# Patient Record
Sex: Female | Born: 1970 | Race: White | Hispanic: No | Marital: Single | Smoking: Current every day smoker
Health system: Southern US, Community
[De-identification: ages and names within clinical notes are randomized; demographics above are authoritative.]

## PROBLEM LIST (undated history)

## (undated) DIAGNOSIS — L0291 Cutaneous abscess, unspecified: Secondary | ICD-10-CM

## (undated) DIAGNOSIS — F17200 Nicotine dependence, unspecified, uncomplicated: Secondary | ICD-10-CM

## (undated) DIAGNOSIS — N920 Excessive and frequent menstruation with regular cycle: Secondary | ICD-10-CM

## (undated) DIAGNOSIS — N943 Premenstrual tension syndrome: Secondary | ICD-10-CM

## (undated) HISTORY — DX: Cutaneous abscess, unspecified: L02.91

## (undated) HISTORY — PX: OTHER SURGICAL HISTORY: SHX169

## (undated) HISTORY — DX: Premenstrual tension syndrome: N94.3

## (undated) HISTORY — DX: Excessive and frequent menstruation with regular cycle: N92.0

## (undated) HISTORY — DX: Nicotine dependence, unspecified, uncomplicated: F17.200

---

## 2000-07-25 ENCOUNTER — Emergency Department (HOSPITAL_COMMUNITY): Admission: EM | Admit: 2000-07-25 | Discharge: 2000-07-26 | Payer: Self-pay | Admitting: Emergency Medicine

## 2000-08-16 ENCOUNTER — Ambulatory Visit (HOSPITAL_COMMUNITY): Admission: RE | Admit: 2000-08-16 | Discharge: 2000-08-16 | Payer: Self-pay | Admitting: Obstetrics and Gynecology

## 2000-08-16 ENCOUNTER — Encounter: Payer: Self-pay | Admitting: Obstetrics and Gynecology

## 2002-06-22 ENCOUNTER — Emergency Department (HOSPITAL_COMMUNITY): Admission: EM | Admit: 2002-06-22 | Discharge: 2002-06-22 | Payer: Self-pay | Admitting: *Deleted

## 2002-06-23 ENCOUNTER — Encounter: Payer: Self-pay | Admitting: *Deleted

## 2007-05-11 ENCOUNTER — Other Ambulatory Visit: Admission: RE | Admit: 2007-05-11 | Discharge: 2007-05-11 | Payer: Self-pay | Admitting: Obstetrics and Gynecology

## 2013-11-20 ENCOUNTER — Ambulatory Visit (INDEPENDENT_AMBULATORY_CARE_PROVIDER_SITE_OTHER): Payer: 59 | Admitting: Women's Health

## 2013-11-20 ENCOUNTER — Encounter: Payer: Self-pay | Admitting: Women's Health

## 2013-11-20 VITALS — BP 130/68 | Ht 64.0 in | Wt 184.0 lb

## 2013-11-20 DIAGNOSIS — L02239 Carbuncle of trunk, unspecified: Secondary | ICD-10-CM

## 2013-11-20 DIAGNOSIS — N611 Abscess of the breast and nipple: Secondary | ICD-10-CM

## 2013-11-20 DIAGNOSIS — L02229 Furuncle of trunk, unspecified: Secondary | ICD-10-CM

## 2013-11-20 MED ORDER — SULFAMETHOXAZOLE-TMP DS 800-160 MG PO TABS: 1.0000 | ORAL_TABLET | Freq: Two times a day (BID) | ORAL | Status: DC

## 2013-11-20 NOTE — Progress Notes (Signed)
Patient ID: Debbie Russell, female   DOB: [birth year], 43 y.o.   MRN: [id number]   [facility] Visit  Patient name: Debbie Russell MRN [id number]  Date of birth: [birth year]  CC & HPI:  Debbie Russell is a 43 y.o. Caucasian female presenting today for report of bump under Rt breast x 6 weeks, started small like a pimple, has gotten much bigger w/in last week. No drainage, heat, fever/chills. Never had bump like this before. Never had mammogram, last pap ~42yrs ago.   Pertinent History Reviewed:  Medical & Surgical Hx:   History reviewed. No pertinent past medical history. Past Surgical History  Procedure Laterality Date  . Tummy tuck     Medications: Reviewed & Updated - see associated section Social History: Reviewed -  reports that she has been smoking Cigarettes.  She has a 14.25 pack-year smoking history. She does not have any smokeless tobacco history on file.  Objective Findings:  Vitals: BP 130/68  Ht  (1.626 m)  Wt 184 lb (83.462 kg)  BMI 31.57 kg/m2  LMP 10/31/2013  Physical Examination: General appearance - alert, well appearing, and in no distress Skin - ~2cm boil under Rt breast towards sternum- where bra underwire lays, erythematous, small head, some induration, no drainage w/ compression, slightly tender, not bad  No results found for this or any previous visit (from the past 24 hour(s)).   Assessment & Plan:  A:   Boil under Rt breast  Needs screening mammogram  Needs pap & physical P:  Rx septra ds bid x 7d, wear bras w/o underwire if possible until heals  F/U 1wk for f/u and pap & physical  Call to schedule screening mammogram  Marge Duncans CNM, [facility] 11/20/2013 2:17 PM

## 2013-11-20 NOTE — Patient Instructions (Addendum)
Schedule a mammogram  Abscess An abscess is an infected area that contains a collection of pus and debris.It can occur in almost any part of the body. An abscess is also known as a furuncle or boil. CAUSES  An abscess occurs when tissue gets infected. This can occur from blockage of oil or sweat glands, infection of hair follicles, or a minor injury to the skin. As the body tries to fight the infection, pus collects in the area and creates pressure under the skin. This pressure causes pain. People with weakened immune systems have difficulty fighting infections and get certain abscesses more often.  SYMPTOMS Usually an abscess develops on the skin and becomes a painful mass that is red, warm, and tender. If the abscess forms under the skin, you may feel a moveable soft area under the skin. Some abscesses break open (rupture) on their own, but most will continue to get worse without care. The infection can spread deeper into the body and eventually into the bloodstream, causing you to feel ill.  DIAGNOSIS  Your caregiver will take your medical history and perform a physical exam. A sample of fluid may also be taken from the abscess to determine what is causing your infection. TREATMENT  Your caregiver may prescribe antibiotic medicines to fight the infection. However, taking antibiotics alone usually does not cure an abscess. Your caregiver may need to make a small cut (incision) in the abscess to drain the pus. In some cases, gauze is packed into the abscess to reduce pain and to continue draining the area. HOME CARE INSTRUCTIONS   Only take over-the-counter or prescription medicines for pain, discomfort, or fever as directed by your caregiver.  If you were prescribed antibiotics, take them as directed. Finish them even if you start to feel better.  If gauze is used, follow your caregiver's directions for changing the gauze.  To avoid spreading the infection:  Keep your draining abscess  covered with a bandage.  Wash your hands well.  Do not share personal care items, towels, or whirlpools with others.  Avoid skin contact with others.  Keep your skin and clothes clean around the abscess.  Keep all follow-up appointments as directed by your caregiver. SEEK MEDICAL CARE IF:   You have increased pain, swelling, redness, fluid drainage, or bleeding.  You have muscle aches, chills, or a general ill feeling.  You have a fever. MAKE SURE YOU:   Understand these instructions.  Will watch your condition.  Will get help right away if you are not doing well or get worse. Document Released: 11/26/2004 Document Revised: 08/18/2011 Document Reviewed: 05/01/2011 [facility] Patient Information 2015 [place], [place]. This information is not intended to replace advice given to you by your health care provider. Make sure you discuss any questions you have with your health care provider.

## 2013-11-23 ENCOUNTER — Other Ambulatory Visit: Payer: 59 | Admitting: Adult Health

## 2013-11-30 ENCOUNTER — Ambulatory Visit (INDEPENDENT_AMBULATORY_CARE_PROVIDER_SITE_OTHER): Payer: 59 | Admitting: Adult Health

## 2013-11-30 ENCOUNTER — Other Ambulatory Visit (HOSPITAL_COMMUNITY)
Admission: RE | Admit: 2013-11-30 | Discharge: 2013-11-30 | Disposition: A | Discharge status: Home / Self Care | Payer: 59 | Source: Ambulatory Visit | Attending: Adult Health | Admitting: Adult Health

## 2013-11-30 ENCOUNTER — Encounter: Payer: Self-pay | Admitting: Adult Health

## 2013-11-30 VITALS — BP 138/60 | HR 78 | Ht 64.25 in | Wt 184.0 lb

## 2013-11-30 DIAGNOSIS — F17210 Nicotine dependence, cigarettes, uncomplicated: Secondary | ICD-10-CM

## 2013-11-30 DIAGNOSIS — Z1151 Encounter for screening for human papillomavirus (HPV): Secondary | ICD-10-CM | POA: Diagnosis present

## 2013-11-30 DIAGNOSIS — Z113 Encounter for screening for infections with a predominantly sexual mode of transmission: Secondary | ICD-10-CM | POA: Diagnosis present

## 2013-11-30 DIAGNOSIS — N920 Excessive and frequent menstruation with regular cycle: Secondary | ICD-10-CM

## 2013-11-30 DIAGNOSIS — F17200 Nicotine dependence, unspecified, uncomplicated: Secondary | ICD-10-CM

## 2013-11-30 DIAGNOSIS — Z1212 Encounter for screening for malignant neoplasm of rectum: Secondary | ICD-10-CM

## 2013-11-30 DIAGNOSIS — N943 Premenstrual tension syndrome: Secondary | ICD-10-CM

## 2013-11-30 DIAGNOSIS — L0291 Cutaneous abscess, unspecified: Secondary | ICD-10-CM

## 2013-11-30 DIAGNOSIS — Z01419 Encounter for gynecological examination (general) (routine) without abnormal findings: Secondary | ICD-10-CM

## 2013-11-30 HISTORY — DX: Nicotine dependence, unspecified, uncomplicated: F17.200

## 2013-11-30 HISTORY — DX: Cutaneous abscess, unspecified: L02.91

## 2013-11-30 HISTORY — DX: Excessive and frequent menstruation with regular cycle: N92.0

## 2013-11-30 HISTORY — DX: Premenstrual tension syndrome: N94.3

## 2013-11-30 LAB — HEMOCCULT GUIAC POC 1CARD (OFFICE): Fecal Occult Blood, POC: NEGATIVE

## 2013-11-30 MED ORDER — BUPROPION HCL ER (XL) 150 MG PO TB24
150.0000 mg | ORAL_TABLET | Freq: Every day | ORAL | Status: AC
Start: 2013-11-30 — End: ?

## 2013-11-30 MED ORDER — SULFAMETHOXAZOLE-TMP DS 800-160 MG PO TABS: 1.0000 | ORAL_TABLET | Freq: Two times a day (BID) | ORAL | Status: AC

## 2013-11-30 NOTE — Patient Instructions (Addendum)
Smoking Cessation Quitting smoking is important to your health and has many advantages. However, it is not always easy to quit since nicotine is a very addictive drug. Oftentimes, people try 3 times or more before being able to quit. This document explains the best ways for you to prepare to quit smoking. Quitting takes hard work and a lot of effort, but you can do it. ADVANTAGES OF QUITTING SMOKING  You will live longer, feel better, and live better.  Your body will feel the impact of quitting smoking almost immediately.  Within 20 minutes, blood pressure decreases. Your pulse returns to its normal level.  After 8 hours, carbon monoxide levels in the blood return to normal. Your oxygen level increases.  After 24 hours, the chance of having a heart attack starts to decrease. Your breath, hair, and body stop smelling like smoke.  After 48 hours, damaged nerve endings begin to recover. Your sense of taste and smell improve.  After 72 hours, the body is virtually free of nicotine. Your bronchial tubes relax and breathing becomes easier.  After 2 to 12 weeks, lungs can hold more air. Exercise becomes easier and circulation improves.  The risk of having a heart attack, stroke, cancer, or lung disease is greatly reduced.  After 1 year, the risk of coronary heart disease is cut in half.  After 5 years, the risk of stroke falls to the same as a nonsmoker.  After 10 years, the risk of lung cancer is cut in half and the risk of other cancers decreases significantly.  After 15 years, the risk of coronary heart disease drops, usually to the level of a nonsmoker.  If you are pregnant, quitting smoking will improve your chances of having a healthy baby.  The people you live with, especially any children, will be healthier.  You will have extra money to spend on things other than cigarettes. QUESTIONS TO THINK ABOUT BEFORE ATTEMPTING TO QUIT You may want to talk about your answers with your  health care provider.  Why do you want to quit?  If you tried to quit in the past, what helped and what did not?  What will be the most difficult situations for you after you quit? How will you plan to handle them?  Who can help you through the tough times? Your family? Friends? A health care provider?  What pleasures do you get from smoking? What ways can you still get pleasure if you quit? Here are some questions to ask your health care provider:  How can you help me to be successful at quitting?  What medicine do you think would be best for me and how should I take it?  What should I do if I need more help?  What is smoking withdrawal like? How can I get information on withdrawal? GET READY  Set a quit date.  Change your environment by getting rid of all cigarettes, ashtrays, matches, and lighters in your home, car, or work. Do not let people smoke in your home.  Review your past attempts to quit. Think about what worked and what did not. GET SUPPORT AND ENCOURAGEMENT You have a better chance of being successful if you have help. You can get support in many ways.  Tell your family, friends, and coworkers that you are going to quit and need their support. Ask them not to smoke around you.  Get individual, group, or telephone counseling and support. Programs are available at local hospitals and health centers. Call   your local health department for information about programs in your area.  Spiritual beliefs and practices may help some smokers quit.  Download a "quit meter" on your computer to keep track of quit statistics, such as how long you have gone without smoking, cigarettes not smoked, and money saved.  Get a self-help book about quitting smoking and staying off tobacco. LEARN NEW SKILLS AND BEHAVIORS  Distract yourself from urges to smoke. Talk to someone, go for a walk, or occupy your time with a task.  Change your normal routine. Take a different route to work.  Drink tea instead of coffee. Eat breakfast in a different place.  Reduce your stress. Take a hot bath, exercise, or read a book.  Plan something enjoyable to do every day. Reward yourself for not smoking.  Explore interactive web-based programs that specialize in helping you quit. GET MEDICINE AND USE IT CORRECTLY Medicines can help you stop smoking and decrease the urge to smoke. Combining medicine with the above behavioral methods and support can greatly increase your chances of successfully quitting smoking.  Nicotine replacement therapy helps deliver nicotine to your body without the negative effects and risks of smoking. Nicotine replacement therapy includes nicotine gum, lozenges, inhalers, nasal sprays, and skin patches. Some may be available over-the-counter and others require a prescription.  Antidepressant medicine helps people abstain from smoking, but how this works is unknown. This medicine is available by prescription.  Nicotinic receptor partial agonist medicine simulates the effect of nicotine in your brain. This medicine is available by prescription. Ask your health care provider for advice about which medicines to use and how to use them based on your health history. Your health care provider will tell you what side effects to look out for if you choose to be on a medicine or therapy. Carefully read the information on the package. Do not use any other product containing nicotine while using a nicotine replacement product.  RELAPSE OR DIFFICULT SITUATIONS Most relapses occur within the first 3 months after quitting. Do not be discouraged if you start smoking again. Remember, most people try several times before finally quitting. You may have symptoms of withdrawal because your body is used to nicotine. You may crave cigarettes, be irritable, feel very hungry, cough often, get headaches, or have difficulty concentrating. The withdrawal symptoms are only temporary. They are strongest  when you first quit, but they will go away within 10-14 days. To reduce the chances of relapse, try to:  Avoid drinking alcohol. Drinking lowers your chances of successfully quitting.  Reduce the amount of caffeine you consume. Once you quit smoking, the amount of caffeine in your body increases and can give you symptoms, such as a rapid heartbeat, sweating, and anxiety.  Avoid smokers because they can make you want to smoke.  Do not let weight gain distract you. Many smokers will gain weight when they quit, usually less than 10 pounds. Eat a healthy diet and stay active. You can always lose the weight gained after you quit.  Find ways to improve your mood other than smoking. FOR MORE INFORMATION  www.smokefree.gov  Document Released: 02/10/2001 Document Revised: 07/03/2013 Document Reviewed: 05/28/2011 [facility] Patient Information 2015 [place], [place]. This information is not intended to replace advice given to you by your health care provider. Make sure you discuss any questions you have with your health care provider. Premenstrual Syndrome Premenstrual syndrome (PMS) is a condition that consists of physical, emotional, and behavioral symptoms that affect women of childbearing age.  PMS occurs 5-14 days before the start of a menstrual period and often recurs in a predictable pattern. The symptoms go away a few days after the menstrual period starts. PMS can interfere in many ways with normal daily activities and can range from mild to severe. When PMS is considered severe, it may be diagnosed as premenstrual dysphoric disorder (PMDD). A small percentage of women are affected by PMS symptoms and an even smaller percentage of those women are affected by PMDD.  CAUSES  The exact cause of PMS is unknown, but it seems to be related to cyclic hormone changes that happen before menstruation. These hormones are thought to affect chemicals in the brain (serotonin) that can influence a person's mood.    SYMPTOMS  Symptoms of PMS recur consistently from month to month and go away completely after the menstrual period starts. The most common emotional or behavioral symptom is mood swings. These mood swings can be disabling and interfere with normal activities of daily living. Other common symptoms include depression and angry outbursts. Other symptoms may include:   Irritability.  Anxiety.  Crying spells.   Food cravings or appetite changes.   Changes in sexual desire.   Confusion.   Aggression.   Social withdrawal.   Poor concentration. The most common physical symptoms include a sense of bloating, breast pain, headaches, and extreme fatigue. Other physical symptoms include:   Backaches.   Swelling of the hands and feet.   Weight gain.   Hot flashes.  DIAGNOSIS  To make a diagnosis, your caregiver will ask questions to confirm that you are having a pattern of symptoms. Symptoms must:   Be present 5 days before the start of your period and be present at least 3 months in a row.   End within 4 days after your period starts.   Interfere with some of your normal activities.  Other conditions, such as thyroid disease, depression, and migraine headaches must be ruled out before a diagnosis of PMS is confirmed.  TREATMENT  Your caregiver may suggest ways to maintain a healthy lifestyle, such as exercise. Over-the-counter pain relievers may ease cramps, aches, pains, headaches, and breast tenderness. However, selective serotonin reuptake inhibitors (SSRIs) are medicines that are most beneficial in improving PMS if taken in the second half of the monthly cycle. They may be taken on a daily basis. The most effective oral contraceptive pill used for symptoms of PMS is one that contains the ingredient drospirenone. Taking 4 days off of the pill instead of the usual 7 days also has shown to increase effectiveness.  There are a number of drugs, dietary supplements, vitamins,  and water pills (diuretics) which have been suggested to be helpful but have not shown to be of any benefit to improving PMS symptoms.  HOME CARE INSTRUCTIONS   For 2-3 months, write down your symptoms, their severity, and how long they last. This may help your caregiver prescribe the best treatment for your symptoms.  Exercise regularly as suggested by your caregiver.  Eat a regular, well-balanced diet.  Avoid caffeine, alcohol, and tobacco consumption.  Limit salt and salty foods to lessen bloating and fluid retention.  Get enough sleep. Practice relaxation techniques.  Drink enough fluids to keep your urine clear or pale yellow.  Take medicines as directed by your caregiver.  Limit stress.  Take a multivitamin as directed by your caregiver. Document Released: 02/14/2000 Document Revised: 11/11/2011 Document Reviewed: 07/06/2011 [facility] Patient Information 2015 [place], [place]. This information is not  intended to replace advice given to you by your health care provider. Make sure you discuss any questions you have with your health care provider. Abscess An abscess is an infected area that contains a collection of pus and debris.It can occur in almost any part of the body. An abscess is also known as a furuncle or boil. CAUSES  An abscess occurs when tissue gets infected. This can occur from blockage of oil or sweat glands, infection of hair follicles, or a minor injury to the skin. As the body tries to fight the infection, pus collects in the area and creates pressure under the skin. This pressure causes pain. People with weakened immune systems have difficulty fighting infections and get certain abscesses more often.  SYMPTOMS Usually an abscess develops on the skin and becomes a painful mass that is red, warm, and tender. If the abscess forms under the skin, you may feel a moveable soft area under the skin. Some abscesses break open (rupture) on their own, but most will continue to  get worse without care. The infection can spread deeper into the body and eventually into the bloodstream, causing you to feel ill.  DIAGNOSIS  Your caregiver will take your medical history and perform a physical exam. A sample of fluid may also be taken from the abscess to determine what is causing your infection. TREATMENT  Your caregiver may prescribe antibiotic medicines to fight the infection. However, taking antibiotics alone usually does not cure an abscess. Your caregiver may need to make a small cut (incision) in the abscess to drain the pus. In some cases, gauze is packed into the abscess to reduce pain and to continue draining the area. HOME CARE INSTRUCTIONS   Only take over-the-counter or prescription medicines for pain, discomfort, or fever as directed by your caregiver.  If you were prescribed antibiotics, take them as directed. Finish them even if you start to feel better.  If gauze is used, follow your caregiver's directions for changing the gauze.  To avoid spreading the infection:  Keep your draining abscess covered with a bandage.  Wash your hands well.  Do not share personal care items, towels, or whirlpools with others.  Avoid skin contact with others.  Keep your skin and clothes clean around the abscess.  Keep all follow-up appointments as directed by your caregiver. SEEK MEDICAL CARE IF:   You have increased pain, swelling, redness, fluid drainage, or bleeding.  You have muscle aches, chills, or a general ill feeling.  You have a fever. MAKE SURE YOU:   Understand these instructions.  Will watch your condition.  Will get help right away if you are not doing well or get worse. Document Released: 11/26/2004 Document Revised: 08/18/2011 Document Reviewed: 05/01/2011 [facility] Patient Information 2015 [place]. This information is not intended to replace advice given to you by your health care provider. Make sure you discuss any questions you have  with your health care provider. Return in 2 weeks Levonorgestrel intrauterine device (IUD) What is this medicine? LEVONORGESTREL IUD (LEE voe nor jes trel) is a contraceptive (birth control) device. The device is placed inside the uterus by a healthcare professional. It is used to prevent pregnancy and can also be used to treat heavy bleeding that occurs during your period. Depending on the device, it can be used for 3 to 5 years. This medicine may be used for other purposes; ask your health care provider or pharmacist if you have questions. COMMON BRAND NAME(S): Theotis BurrowLILETTA, Mirena, Christean GriefSkyla  What should I tell my health care provider before I take this medicine? They need to know if you have any of these conditions: -abnormal Pap smear -cancer of the breast, uterus, or cervix -diabetes -endometritis -genital or pelvic infection now or in the past -have more than one sexual partner or your partner has more than one partner -heart disease -history of an ectopic or tubal pregnancy -immune system problems -IUD in place -liver disease or tumor -problems with blood clots or take blood-thinners -use intravenous drugs -uterus of unusual shape -vaginal bleeding that has not been explained -an unusual or allergic reaction to levonorgestrel, other hormones, silicone, or polyethylene, medicines, foods, dyes, or preservatives -pregnant or trying to get pregnant -breast-feeding How should I use this medicine? This device is placed inside the uterus by a health care professional. Talk to your pediatrician regarding the use of this medicine in children. Special care may be needed. Overdosage: If you think you have taken too much of this medicine contact a poison control center or emergency room at once. NOTE: This medicine is only for you. Do not share this medicine with others. What if I miss a dose? This does not apply. What may interact with this medicine? Do not take this medicine with any of the  following medications: -amprenavir -bosentan -fosamprenavir This medicine may also interact with the following medications: -aprepitant -barbiturate medicines for inducing sleep or treating seizures -bexarotene -griseofulvin -medicines to treat seizures like carbamazepine, ethotoin, felbamate, oxcarbazepine, phenytoin, topiramate -modafinil -pioglitazone -rifabutin -rifampin -rifapentine -some medicines to treat HIV infection like atazanavir, indinavir, lopinavir, nelfinavir, tipranavir, ritonavir -St. John's wort -warfarin This list may not describe all possible interactions. Give your health care provider a list of all the medicines, herbs, non-prescription drugs, or dietary supplements you use. Also tell them if you smoke, drink alcohol, or use illegal drugs. Some items may interact with your medicine. What should I watch for while using this medicine? Visit your doctor or health care professional for regular check ups. See your doctor if you or your partner has sexual contact with others, becomes HIV positive, or gets a sexual transmitted disease. This product does not protect you against HIV infection (AIDS) or other sexually transmitted diseases. You can check the placement of the IUD yourself by reaching up to the top of your vagina with clean fingers to feel the threads. Do not pull on the threads. It is a good habit to check placement after each menstrual period. Call your doctor right away if you feel more of the IUD than just the threads or if you cannot feel the threads at all. The IUD may come out by itself. You may become pregnant if the device comes out. If you notice that the IUD has come out use a backup birth control method like condoms and call your health care provider. Using tampons will not change the position of the IUD and are okay to use during your period. What side effects may I notice from receiving this medicine? Side effects that you should report to your  doctor or health care professional as soon as possible: -allergic reactions like skin rash, itching or hives, swelling of the face, lips, or tongue -fever, flu-like symptoms -genital sores -high blood pressure -no menstrual period for 6 weeks during use -pain, swelling, warmth in the leg -pelvic pain or tenderness -severe or sudden headache -signs of pregnancy -stomach cramping -sudden shortness of breath -trouble with balance, talking, or walking -unusual vaginal bleeding, discharge -yellowing of  the eyes or skin Side effects that usually do not require medical attention (report to your doctor or health care professional if they continue or are bothersome): -acne -breast pain -change in sex drive or performance -changes in weight -cramping, dizziness, or faintness while the device is being inserted -headache -irregular menstrual bleeding within first 3 to 6 months of use -nausea This list may not describe all possible side effects. Call your doctor for medical advice about side effects. You may report side effects to FDA at 1-800-FDA-1088. Where should I keep my medicine? This does not apply. NOTE: This sheet is a summary. It may not cover all possible information. If you have questions about this medicine, talk to your doctor, pharmacist, or health care provider.  2015, [employer]. (2011-03-19 13:54:04)

## 2013-11-30 NOTE — Progress Notes (Signed)
Patient ID: Debbie Russell, female   DOB: 12/26/1970, 43 y.o.   MRN: [id number] History of Present Illness: Debbie Russell is a 43 year old white female, in for a pap and physical.She is complaining of a boil under right breast and heavy periods with some cramps and PMS like symptoms.   Current Medications, Allergies, Past Medical History, Past Surgical History, Family History and Social History were reviewed in [facility] electronic medical record.     Review of Systems: Patient denies any headaches, blurred vision, shortness of breath, chest pain, abdominal pain, problems with bowel movements, urination, or intercourse. No joint pain, see HPI for positives.     Physical Exam:BP 138/60  Pulse 78  Ht 5' 4.25" (1.632 m)  Wt 184 lb (83.462 kg)  BMI 31.34 kg/m2  LMP 11/22/2013 General:  Well developed, well nourished, no acute distress Skin:  Warm and dry Neck:  Midline trachea, normal thyroid Lungs; Clear to auscultation bilaterally Breast:  No dominant palpable mass, retraction, or nipple discharge, there is a 3 x 1.5 cm red abscess under right breast,skin is intact Cardiovascular: Regular rate and rhythm Abdomen:  Soft, non tender, no hepatosplenomegaly, sp tummy tuck Pelvic:  External genitalia is normal in appearance.  The vagina is normal in appearance.   The cervix is bulbous.Pap with HPV and GC/CHL performed,by Verne SpurrM Flitchum, NP student.  Uterus is felt to be normal size, shape, and contour.  No   adnexal masses or tenderness noted. Rectal: Good sphincter tone, no polyps, or hemorrhoids felt.  Hemoccult negative. Extremities:  No swelling or varicosities noted Psych:  No mood changes, alert and cooperative,seems happy Discussed ablation vs IUD vs POP for periods.  Impression: Well woman gyn exam Abscess Heavy periods PMS Nicotine addiction    Plan: Rx septra ds 1 bid x 14 days #28 with refill Rx wellbutrin 150 mg XL #30 1 daily with 6 refills Return in 2 weeks for follow up for  abscess and discuss smoking cessation and period management Physical in 1 year Review handouts on ablation.IUD and smoking cessation

## 2013-12-06 LAB — CYTOLOGY - PAP

## 2013-12-13 ENCOUNTER — Ambulatory Visit: Payer: 59 | Admitting: Adult Health

## 2018-08-16 ENCOUNTER — Other Ambulatory Visit (HOSPITAL_COMMUNITY): Payer: Self-pay | Admitting: Nurse Practitioner

## 2018-08-16 ENCOUNTER — Other Ambulatory Visit: Payer: Self-pay | Admitting: Nurse Practitioner

## 2018-08-16 DIAGNOSIS — N921 Excessive and frequent menstruation with irregular cycle: Secondary | ICD-10-CM

## 2018-08-16 DIAGNOSIS — N852 Hypertrophy of uterus: Secondary | ICD-10-CM

## 2018-08-16 DIAGNOSIS — Z1231 Encounter for screening mammogram for malignant neoplasm of breast: Secondary | ICD-10-CM

## 2018-08-19 ENCOUNTER — Ambulatory Visit (HOSPITAL_COMMUNITY)
Admission: RE | Admit: 2018-08-19 | Discharge: 2018-08-19 | Disposition: A | Discharge status: Home / Self Care | Payer: Self-pay | Source: Ambulatory Visit | Attending: Nurse Practitioner | Admitting: Nurse Practitioner

## 2018-08-19 ENCOUNTER — Other Ambulatory Visit: Payer: Self-pay

## 2018-08-19 DIAGNOSIS — N852 Hypertrophy of uterus: Secondary | ICD-10-CM | POA: Insufficient documentation

## 2018-08-19 DIAGNOSIS — N921 Excessive and frequent menstruation with irregular cycle: Secondary | ICD-10-CM | POA: Insufficient documentation

## 2018-08-25 ENCOUNTER — Ambulatory Visit (HOSPITAL_COMMUNITY): Payer: Self-pay

## 2018-08-25 ENCOUNTER — Encounter (HOSPITAL_COMMUNITY): Payer: Self-pay

## 2019-09-30 ENCOUNTER — Encounter (HOSPITAL_COMMUNITY): Payer: Self-pay | Admitting: Emergency Medicine

## 2019-09-30 ENCOUNTER — Other Ambulatory Visit: Payer: Self-pay

## 2019-09-30 ENCOUNTER — Emergency Department (HOSPITAL_COMMUNITY)
Admission: EM | Admit: 2019-09-30 | Discharge: 2019-09-30 | Disposition: A | Discharge status: Home / Self Care | Payer: Medicaid Other | Attending: Emergency Medicine | Admitting: Emergency Medicine

## 2019-09-30 DIAGNOSIS — R109 Unspecified abdominal pain: Secondary | ICD-10-CM | POA: Insufficient documentation

## 2019-09-30 DIAGNOSIS — Z5321 Procedure and treatment not carried out due to patient leaving prior to being seen by health care provider: Secondary | ICD-10-CM | POA: Insufficient documentation

## 2019-09-30 LAB — URINALYSIS, ROUTINE W REFLEX MICROSCOPIC
Bilirubin Urine: NEGATIVE
Glucose, UA: NEGATIVE mg/dL
Ketones, ur: NEGATIVE mg/dL
Leukocytes,Ua: NEGATIVE
Nitrite: NEGATIVE
Protein, ur: 30 mg/dL — AB
Specific Gravity, Urine: 1.023 (ref 1.005–1.030)
pH: 6 (ref 5.0–8.0)

## 2019-09-30 LAB — CBC
HCT: 36.7 % (ref 36.0–46.0)
Hemoglobin: 11.8 g/dL — ABNORMAL LOW (ref 12.0–15.0)
MCH: 26.3 pg (ref 26.0–34.0)
MCHC: 32.2 g/dL (ref 30.0–36.0)
MCV: 81.9 fL (ref 80.0–100.0)
Platelets: 303 10*3/uL (ref 150–400)
RBC: 4.48 MIL/uL (ref 3.87–5.11)
RDW: 14.6 % (ref 11.5–15.5)
WBC: 17 10*3/uL — ABNORMAL HIGH (ref 4.0–10.5)
nRBC: 0 % (ref 0.0–0.2)

## 2019-09-30 LAB — BASIC METABOLIC PANEL
Anion gap: 10 (ref 5–15)
BUN: 12 mg/dL (ref 6–20)
CO2: 18 mmol/L — ABNORMAL LOW (ref 22–32)
Calcium: 8.2 mg/dL — ABNORMAL LOW (ref 8.9–10.3)
Chloride: 103 mmol/L (ref 98–111)
Creatinine, Ser: 0.83 mg/dL (ref 0.44–1.00)
GFR calc Af Amer: >60 mL/min (ref >60)
GFR calc non Af Amer: >60 mL/min (ref >60)
Glucose, Bld: 123 mg/dL — ABNORMAL HIGH (ref 70–99)
Potassium: 2.8 mmol/L — ABNORMAL LOW (ref 3.5–5.1)
Sodium: 131 mmol/L — ABNORMAL LOW (ref 135–145)

## 2019-09-30 NOTE — ED Triage Notes (Signed)
Pt c/o of abdominal pain with n/d since last night

## 2019-09-30 NOTE — ED Notes (Signed)
Pt called for room. No answer. Pt not in WR

## 2020-03-16 IMAGING — US US PELVIS COMPLETE WITH TRANSVAGINAL
1 series · 13 of 25 positions shown · non-contrast
Comparison: None

CLINICAL DATA: Enlarged uterus, menorrhagia with irregular cycle,
multiple menses per month for 6 months with pain

EXAM:
TRANSABDOMINAL AND TRANSVAGINAL ULTRASOUND OF PELVIS
TECHNIQUE: Both transabdominal and transvaginal ultrasound examinations of the
pelvis were performed. Transabdominal technique was performed for
global imaging of the pelvis including uterus, ovaries, adnexal
regions, and pelvic cul-de-sac. It was necessary to proceed with
endovaginal exam following the transabdominal exam to visualize the
endometrium and ovaries.

[Series 1: us pelvis complete with transvaginal · 0.25mm/px · 13 of 215 slices shown]
[im 1/215]
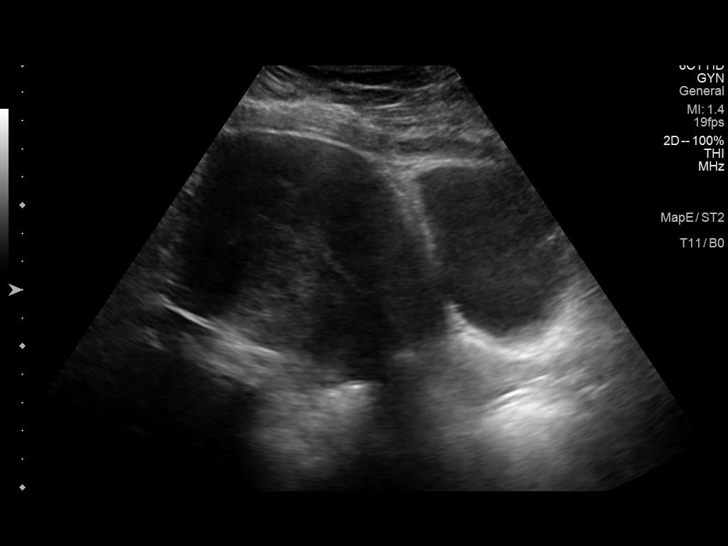
[im 18/215]
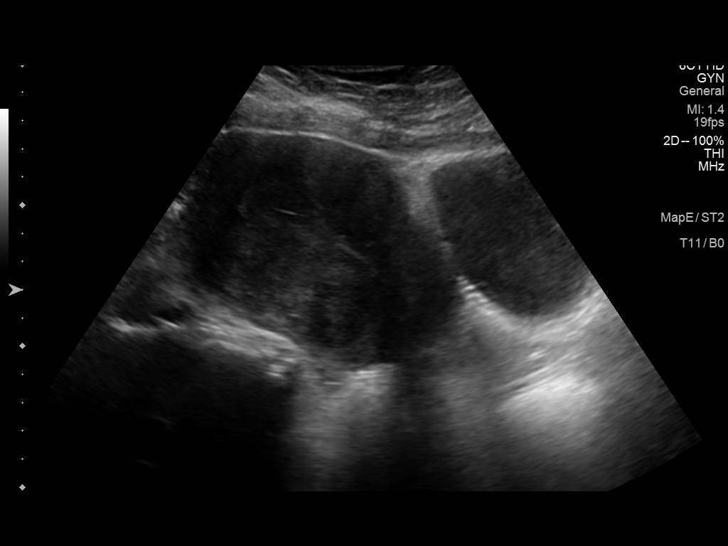
[im 36/215]
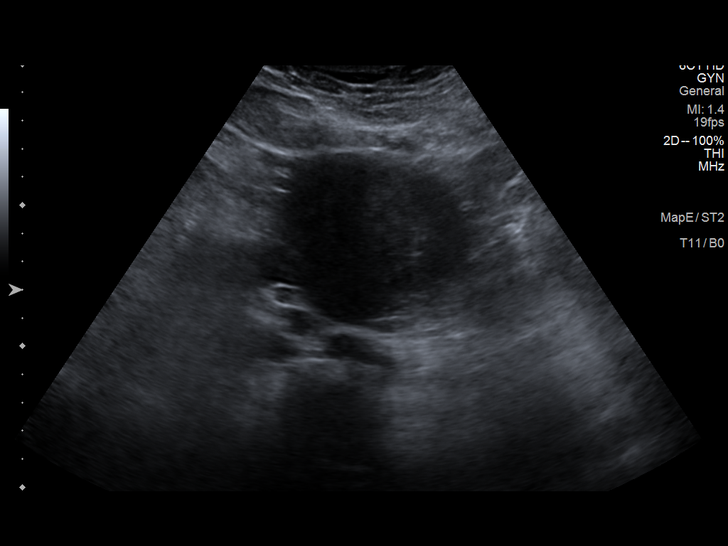
[im 54/215]
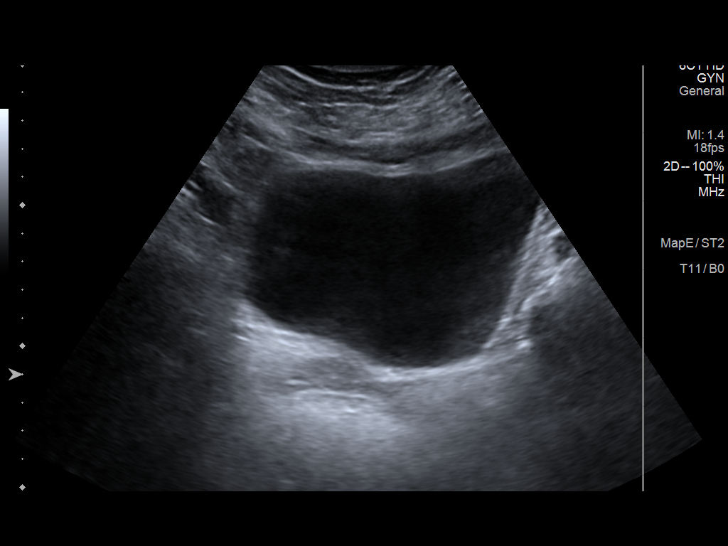
[im 72/215]
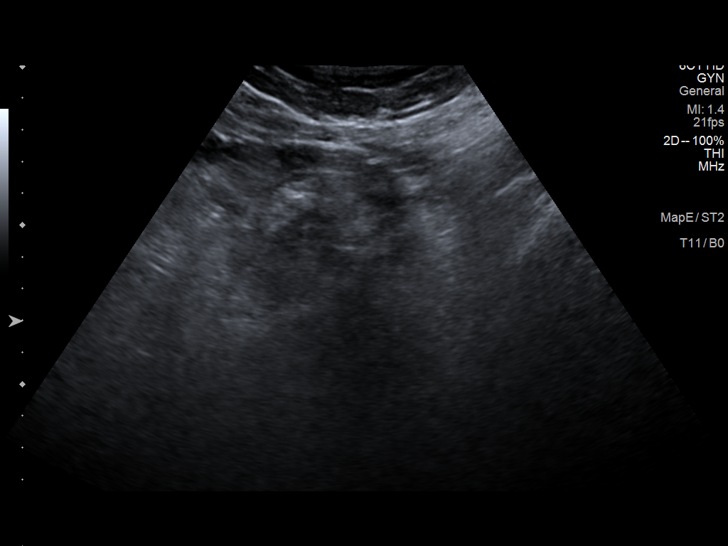
[im 90/215]
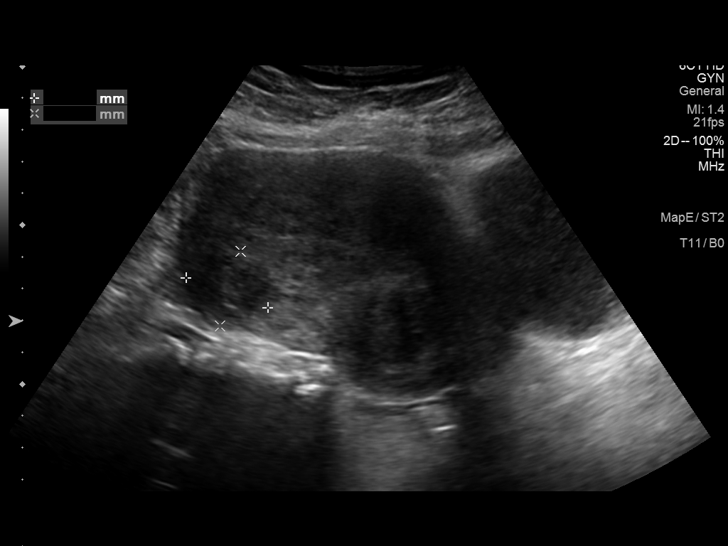
[im 108/215]
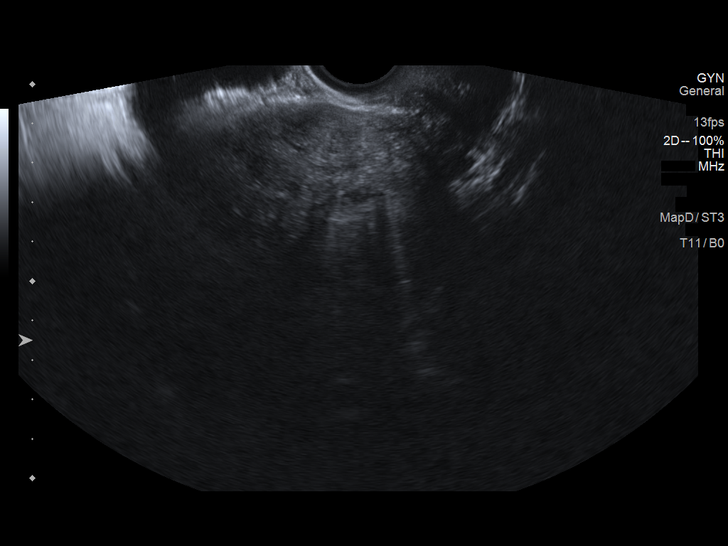
[im 125/215]
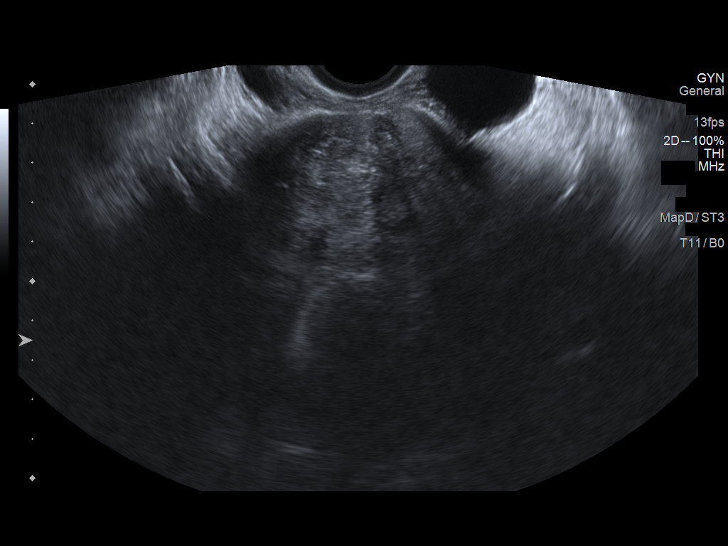
[im 143/215]
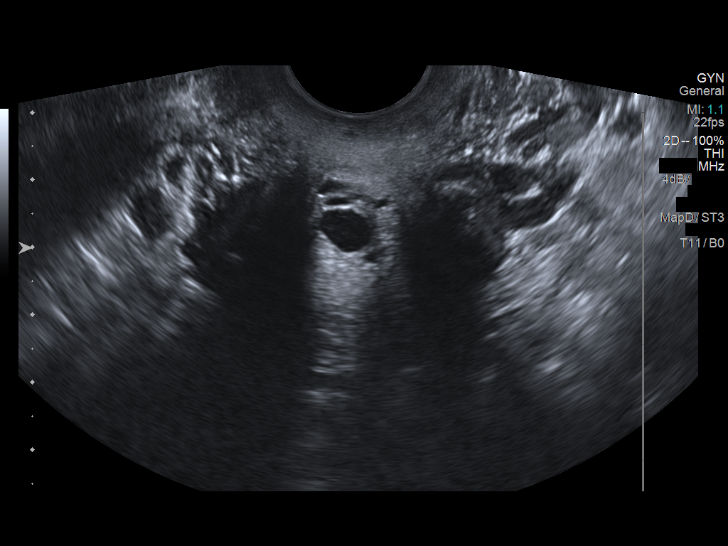
[im 161/215]
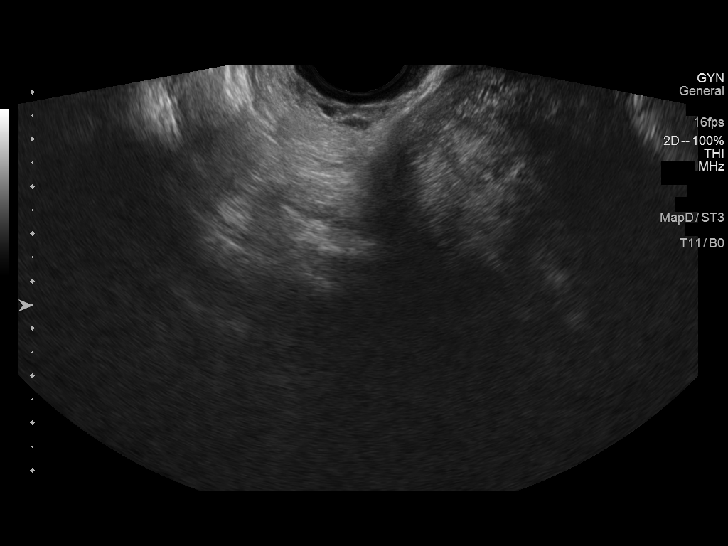
[im 179/215]
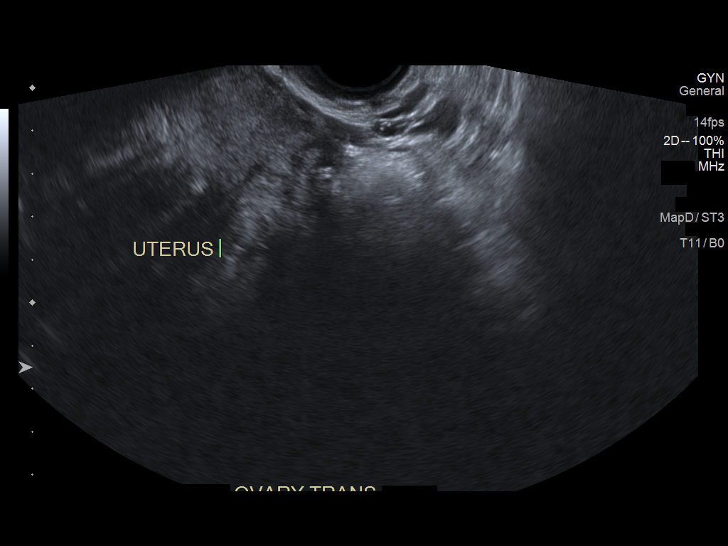
[im 197/215]
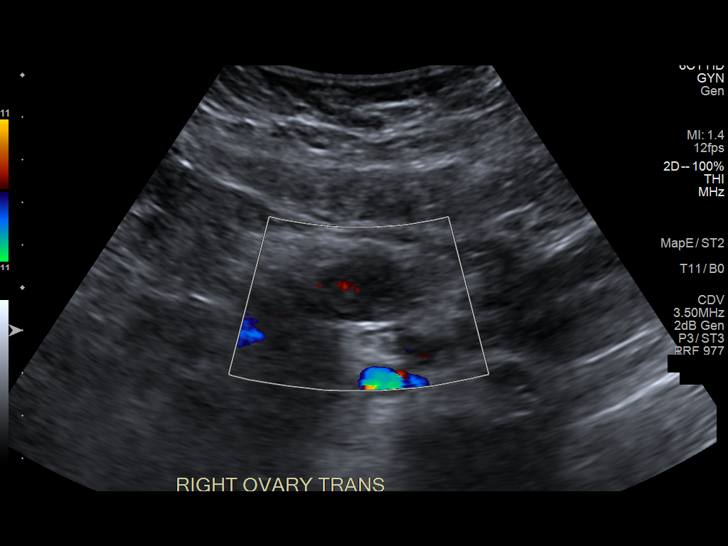
[im 215/215]
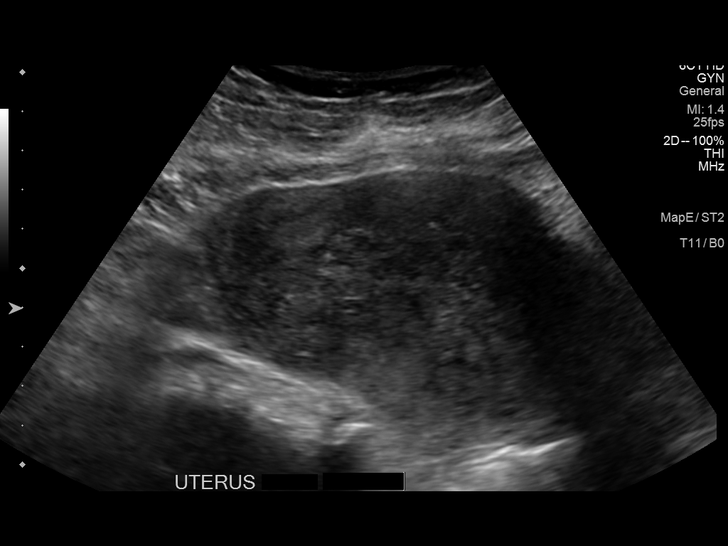

[13 of 25 positions shown; findings below may reference images not displayed]

FINDINGS: Uterus

Measurements: 12.8 x 8.0 x 9.4 cm = volume: 503 mL. Heterogeneous
myometrial echogenicity. Posterior mid uterine leiomyoma 3.8 x 3.9 x
3.7 cm, transmural, extending from subserosal to submucosal.
Additional smaller subserosal leiomyoma at fundus 2.7 x 2.4 x
cm.

Endometrium

Thickness: 18 mm.  No endometrial fluid or focal abnormality

Right ovary

Measurements: 4.0 x 1.7 x 2.5 cm = volume: 8.9 mL. Normal morphology
without mass

Left ovary

Measurements: 3.1 x 1.4 x 1.7 cm = volume: 3.9 mL. Normal morphology
without mass

Other findings

No free pelvic fluid.  No adnexal masses.
IMPRESSION: Enlarged uterus containing 2 uterine leiomyomata, the larger of
which measures a maximum of 3.9 cm at the posterior mid uterus and
extends to submucosal.

18 mm thick endometrial complex, abnormal, see below.

If bleeding remains unresponsive to hormonal or medical therapy,
focal lesion work-up with sonohysterogram should be considered.
Endometrial biopsy should also be considered in pre-menopausal
patients at high risk for endometrial carcinoma. (Ref: Radiological
Reasoning: Algorithmic Workup of Abnormal Vaginal Bleeding with
Endovaginal Sonography and Sonohysterography. AJR [id number]; 191:S68-73)

## 2022-06-01 DIAGNOSIS — Z419 Encounter for procedure for purposes other than remedying health state, unspecified: Secondary | ICD-10-CM | POA: Diagnosis not present

## 2022-07-01 DIAGNOSIS — Z419 Encounter for procedure for purposes other than remedying health state, unspecified: Secondary | ICD-10-CM | POA: Diagnosis not present

## 2022-08-01 DIAGNOSIS — Z419 Encounter for procedure for purposes other than remedying health state, unspecified: Secondary | ICD-10-CM | POA: Diagnosis not present

## 2022-08-31 DIAGNOSIS — Z419 Encounter for procedure for purposes other than remedying health state, unspecified: Secondary | ICD-10-CM | POA: Diagnosis not present

## 2022-10-01 DIAGNOSIS — Z419 Encounter for procedure for purposes other than remedying health state, unspecified: Secondary | ICD-10-CM | POA: Diagnosis not present

## 2022-11-01 DIAGNOSIS — Z419 Encounter for procedure for purposes other than remedying health state, unspecified: Secondary | ICD-10-CM | POA: Diagnosis not present

## 2022-12-01 DIAGNOSIS — Z419 Encounter for procedure for purposes other than remedying health state, unspecified: Secondary | ICD-10-CM | POA: Diagnosis not present

## 2023-01-01 DIAGNOSIS — Z419 Encounter for procedure for purposes other than remedying health state, unspecified: Secondary | ICD-10-CM | POA: Diagnosis not present

## 2023-01-31 DIAGNOSIS — Z419 Encounter for procedure for purposes other than remedying health state, unspecified: Secondary | ICD-10-CM | POA: Diagnosis not present

## 2023-03-03 DIAGNOSIS — Z419 Encounter for procedure for purposes other than remedying health state, unspecified: Secondary | ICD-10-CM | POA: Diagnosis not present

## 2023-04-03 DIAGNOSIS — Z419 Encounter for procedure for purposes other than remedying health state, unspecified: Secondary | ICD-10-CM | POA: Diagnosis not present

## 2023-05-01 DIAGNOSIS — Z419 Encounter for procedure for purposes other than remedying health state, unspecified: Secondary | ICD-10-CM | POA: Diagnosis not present

## 2023-06-12 DIAGNOSIS — Z419 Encounter for procedure for purposes other than remedying health state, unspecified: Secondary | ICD-10-CM | POA: Diagnosis not present

## 2023-07-12 DIAGNOSIS — Z419 Encounter for procedure for purposes other than remedying health state, unspecified: Secondary | ICD-10-CM | POA: Diagnosis not present

## 2023-08-12 DIAGNOSIS — Z419 Encounter for procedure for purposes other than remedying health state, unspecified: Secondary | ICD-10-CM | POA: Diagnosis not present

## 2023-09-11 DIAGNOSIS — Z419 Encounter for procedure for purposes other than remedying health state, unspecified: Secondary | ICD-10-CM | POA: Diagnosis not present

## 2023-10-12 DIAGNOSIS — Z419 Encounter for procedure for purposes other than remedying health state, unspecified: Secondary | ICD-10-CM | POA: Diagnosis not present

## 2023-11-12 DIAGNOSIS — Z419 Encounter for procedure for purposes other than remedying health state, unspecified: Secondary | ICD-10-CM | POA: Diagnosis not present

## 2024-01-12 DIAGNOSIS — Z419 Encounter for procedure for purposes other than remedying health state, unspecified: Secondary | ICD-10-CM | POA: Diagnosis not present

## 2024-01-24 DIAGNOSIS — L409 Psoriasis, unspecified: Secondary | ICD-10-CM | POA: Diagnosis not present

## 2024-01-24 DIAGNOSIS — E669 Obesity, unspecified: Secondary | ICD-10-CM | POA: Diagnosis not present

## 2024-01-24 DIAGNOSIS — L309 Dermatitis, unspecified: Secondary | ICD-10-CM | POA: Diagnosis not present

## 2024-02-15 DIAGNOSIS — Z1211 Encounter for screening for malignant neoplasm of colon: Secondary | ICD-10-CM | POA: Diagnosis not present

## 2024-02-15 DIAGNOSIS — L988 Other specified disorders of the skin and subcutaneous tissue: Secondary | ICD-10-CM | POA: Diagnosis not present

## 2024-02-15 DIAGNOSIS — Z1231 Encounter for screening mammogram for malignant neoplasm of breast: Secondary | ICD-10-CM | POA: Diagnosis not present

## 2024-02-15 DIAGNOSIS — Z13220 Encounter for screening for lipoid disorders: Secondary | ICD-10-CM | POA: Diagnosis not present

## 2024-02-15 DIAGNOSIS — Z0001 Encounter for general adult medical examination with abnormal findings: Secondary | ICD-10-CM | POA: Diagnosis not present

## 2024-02-15 DIAGNOSIS — Z13228 Encounter for screening for other metabolic disorders: Secondary | ICD-10-CM | POA: Diagnosis not present

## 2024-02-15 DIAGNOSIS — E669 Obesity, unspecified: Secondary | ICD-10-CM | POA: Diagnosis not present

## 2024-02-17 ENCOUNTER — Encounter (INDEPENDENT_AMBULATORY_CARE_PROVIDER_SITE_OTHER): Payer: Self-pay | Admitting: *Deleted

## 2024-03-06 ENCOUNTER — Encounter (HOSPITAL_COMMUNITY): Payer: Self-pay

## 2024-03-06 ENCOUNTER — Emergency Department (HOSPITAL_COMMUNITY)
Admission: EM | Admit: 2024-03-06 | Discharge: 2024-03-06 | Disposition: A | Discharge status: Home / Self Care | Attending: Emergency Medicine | Admitting: Emergency Medicine

## 2024-03-06 ENCOUNTER — Emergency Department (HOSPITAL_COMMUNITY)

## 2024-03-06 DIAGNOSIS — W11XXXA Fall on and from ladder, initial encounter: Secondary | ICD-10-CM | POA: Diagnosis not present

## 2024-03-06 DIAGNOSIS — S0181XA Laceration without foreign body of other part of head, initial encounter: Secondary | ICD-10-CM | POA: Insufficient documentation

## 2024-03-06 DIAGNOSIS — M25551 Pain in right hip: Secondary | ICD-10-CM | POA: Diagnosis not present

## 2024-03-06 DIAGNOSIS — W19XXXA Unspecified fall, initial encounter: Secondary | ICD-10-CM

## 2024-03-06 DIAGNOSIS — S0191XA Laceration without foreign body of unspecified part of head, initial encounter: Secondary | ICD-10-CM

## 2024-03-06 DIAGNOSIS — Z23 Encounter for immunization: Secondary | ICD-10-CM | POA: Diagnosis not present

## 2024-03-06 DIAGNOSIS — M79644 Pain in right finger(s): Secondary | ICD-10-CM | POA: Diagnosis not present

## 2024-03-06 MED ORDER — LIDOCAINE-EPINEPHRINE-TETRACAINE (LET) TOPICAL GEL
3.0000 mL | Freq: Once | TOPICAL | Status: AC
  Administered 2024-03-06: 3 mL via TOPICAL
  Filled 2024-03-06: qty 3

## 2024-03-06 MED ORDER — HYDROCODONE-ACETAMINOPHEN 5-325 MG PO TABS: 1.0000 | ORAL_TABLET | Freq: Four times a day (QID) | ORAL | 0 refills | Status: AC | PRN

## 2024-03-06 MED ORDER — ONDANSETRON 4 MG PO TBDP
4.0000 mg | ORAL_TABLET | Freq: Once | ORAL | Status: AC
  Administered 2024-03-06: 4 mg via ORAL
  Filled 2024-03-06: qty 1

## 2024-03-06 MED ORDER — LIDOCAINE-EPINEPHRINE (PF) 2 %-1:200000 IJ SOLN
10.0000 mL | Freq: Once | INTRAMUSCULAR | Status: AC
  Administered 2024-03-06: 10 mL
  Filled 2024-03-06: qty 20

## 2024-03-06 MED ORDER — OXYCODONE-ACETAMINOPHEN 5-325 MG PO TABS
1.0000 | ORAL_TABLET | Freq: Once | ORAL | Status: AC
  Administered 2024-03-06: 1 via ORAL
  Filled 2024-03-06: qty 1

## 2024-03-06 MED ORDER — ONDANSETRON 4 MG PO TBDP: 4.0000 mg | ORAL_TABLET | Freq: Three times a day (TID) | ORAL | 0 refills | Status: AC | PRN

## 2024-03-06 MED ORDER — BACITRACIN ZINC 500 UNIT/GM EX OINT
TOPICAL_OINTMENT | Freq: Once | CUTANEOUS | Status: AC
  Administered 2024-03-06: 1 via TOPICAL
  Filled 2024-03-06: qty 0.9

## 2024-03-06 MED ORDER — TETANUS-DIPHTH-ACELL PERTUSSIS 5-2-15.5 LF-MCG/0.5 IM SUSP
0.5000 mL | Freq: Once | INTRAMUSCULAR | Status: AC
  Administered 2024-03-06: 0.5 mL via INTRAMUSCULAR
  Filled 2024-03-06: qty 0.5

## 2024-03-06 NOTE — ED Triage Notes (Signed)
 Pt c/o R hand pain, R hip/thigh pain, and 2in laceration above R eye r/t a fall this morning.  Pain score 6/10.  Pt reports she fell off a ladder and hit her head on the floor.  Sts she had climbed 3-4 steps.  Pt reports she was wearing glasses.  Denies LOC.      Unknown last tetanus shot.

## 2024-03-06 NOTE — ED Provider Notes (Signed)
 " [place] EMERGENCY DEPARTMENT AT [facility] Provider Note   CSN: [id number] Arrival date & time: 03/06/24  1129     Patient presents with: Fall and Head Injury   Debbie Russell is a 54 y.o. female.   Patient with no pertinent past medical history presents today with complaints of mechanical fall.  Reports that same occurred prior to arrival today when she was up on a ladder and the ladder fell and she struck the front of her forehead as well as her right hip on the ground.  She did not lose consciousness.  She is not anticoagulated.  She was able to get up off the ground and walk, however does report significant pain in her right hip.  Laceration noted to the forehead.  No vision changes, nausea, or vomiting.  Also reports some pain to her right hand she thinks from catching herself.  She is unsure of her last tetanus.   The history is provided by the patient. No language interpreter was used.  Fall  Head Injury      Prior to Admission medications  Medication Sig Start Date End Date Taking? Authorizing Provider  buPROPion  (WELLBUTRIN  XL) 150 MG 24 hr tablet Take 1 tablet (150 mg total) by mouth daily. 11/30/13   Signa Delon LABOR, NP  ibuprofen (ADVIL,MOTRIN) 200 MG tablet Take 200 mg by mouth every 6 (six) hours as needed.    [provider]  sulfamethoxazole -trimethoprim (BACTRIM DS) 800-160 MG per tablet Take 1 tablet by mouth 2 (two) times daily. 11/30/13   Signa Delon LABOR, NP    Allergies: Patient has no known allergies.    Review of Systems  Musculoskeletal:  Positive for arthralgias.  Skin:  Positive for wound.  All other systems reviewed and are negative.   Updated Vital Signs BP 131/84 (BP Location: Left Arm)   Pulse 75   Temp 98.6 F (37 C) (Oral)   Resp 18   Ht 5' 6 (1.676 m)   Wt 83.5 kg   SpO2 100%   BMI 29.70 kg/m   Physical Exam Vitals and nursing note reviewed.  Constitutional:      General: She is not in acute  distress.    Appearance: Normal appearance. She is normal weight. She is not ill-appearing, toxic-appearing or diaphoretic.  HENT:     Head: Normocephalic and atraumatic.     Comments: No racoon eyes No battle sign  8 cm stellate laceration noted to the right upper eyelid through the eyebrow. Wound does not traverse through the eyelid. No active bleeding, intact facial muscles. Kerney is not visualized. No signs of foreign body Eyes:     Extraocular Movements: Extraocular movements intact.     Pupils: Pupils are equal, round, and reactive to light.     Comments: No pain with EOMs. No hyphema, proptosis. Vision intact.   Cardiovascular:     Rate and Rhythm: Normal rate.     Comments: No tenderness to palpation of the anterior chest wall Pulmonary:     Effort: Pulmonary effort is normal. No respiratory distress.  Abdominal:     Comments: No abdominal tenderness or bruising  Musculoskeletal:        General: Normal range of motion.     Cervical back: Normal and normal range of motion.     Thoracic back: Normal.     Lumbar back: Normal.     Comments: No midline tenderness, no stepoffs or deformity noted on palpation of cervical, thoracic,  and lumbar spine  Dorsal aspect of the right thumb with some tenderness to palpation along thenar eminence.  ROM intact with some discomfort.  No pain with flexion extension of the wrist.  Compartments are soft, no snuffbox tenderness.  No pain to the wrist.  TTP noted to the lateral right hip/femur area. No wound or sign of foreign body presence. Hematoma noted. No obvious deformity. DP and PT pulses intact and 2+.  Compartments soft.  Ambulatory with slow but steady gait.   Skin:    General: Skin is warm and dry.  Neurological:     General: No focal deficit present.     Mental Status: She is alert and oriented to person, place, and time.  Psychiatric:        Mood and Affect: Mood normal.        Behavior: Behavior normal.     (all labs ordered  are listed, but only abnormal results are displayed) Labs Reviewed - No data to display  EKG: None  Radiology: DG Femur Min 2 Views Right Result Date: 03/06/2024 CLINICAL DATA:  Status post fall. EXAM: RIGHT FEMUR 2 VIEWS COMPARISON:  None Available. FINDINGS: There is no evidence of fracture or other focal bone lesions. A 7 mm linear radiopaque suture like focus is seen within the soft tissues adjacent to the lateral aspect of the mid right femoral shaft. Soft tissues are otherwise unremarkable. IMPRESSION: 1. No acute osseous abnormality. 2. Findings which may represent a small radiopaque foreign body within the soft tissues adjacent to the lateral aspect of the mid right femoral shaft. Correlation with physical examination is recommended. Electronically Signed   By: Suzen Dials M.D.   On: 03/06/2024 13:49   DG Hand Complete Right Result Date: 03/06/2024 CLINICAL DATA:  Status post fall. EXAM: RIGHT HAND - COMPLETE 3+ VIEW COMPARISON:  None Available. FINDINGS: There is no evidence of fracture or dislocation. There is no evidence of arthropathy or other focal bone abnormality. Soft tissues are unremarkable. IMPRESSION: Negative. Electronically Signed   By: Suzen Dials M.D.   On: 03/06/2024 13:46   DG Pelvis 1-2 Views Result Date: 03/06/2024 CLINICAL DATA:  Status post fall. EXAM: PELVIS - 1-2 VIEW COMPARISON:  None Available. FINDINGS: There is no evidence of pelvic fracture or diastasis. No pelvic bone lesions are seen. IMPRESSION: Negative. Electronically Signed   By: Suzen Dials M.D.   On: 03/06/2024 13:44   CT Head Wo Contrast Result Date: 03/06/2024 EXAM: CT HEAD WITHOUT 03/06/2024 12:53:33 PM TECHNIQUE: CT of the head was performed without the administration of intravenous contrast. Automated exposure control, iterative reconstruction, and/or weight based adjustment of the mA/kV was utilized to reduce the radiation dose to as low as reasonably achievable. COMPARISON: None  available. CLINICAL HISTORY: Head trauma, moderate-severe. FINDINGS: BRAIN AND VENTRICLES: No acute intracranial hemorrhage. No mass effect or midline shift. No extra-axial fluid collection. No evidence of acute infarct. No hydrocephalus. ORBITS: No acute abnormality. SINUSES AND MASTOIDS: No acute abnormality. SOFT TISSUES AND SKULL: There is a laceration of the right forehead with mild soft tissue swelling. The calvaria is intact. IMPRESSION: 1. No acute intracranial abnormality. No calvarial fracture. 2. Laceration of the right forehead with mild soft tissue swelling. Electronically signed by: Evalene Coho MD 03/06/2024 12:56 PM EST RP Workstation: [id number]     .Laceration Repair  Date/Time: 03/06/2024 4:53 PM  Performed by: Nora Lauraine LABOR, PA-C Authorized by: Nora Lauraine LABOR, PA-C   Consent:    Consent obtained:  Verbal   Consent given by:  Patient   Risks, benefits, and alternatives were discussed: yes     Risks discussed:  Infection, pain, retained foreign body, tendon damage, vascular damage, poor wound healing, poor cosmetic result, need for additional repair and nerve damage   Alternatives discussed:  No treatment, delayed treatment, observation and referral Universal protocol:    Procedure explained and questions answered to patient or proxy's satisfaction: yes     Test results available: yes     Imaging studies available: yes     Patient identity confirmed:  Verbally with patient Anesthesia:    Anesthesia method:  Topical application and local infiltration   Topical anesthetic:  LET   Local anesthetic:  Lidocaine  2% WITH epi Laceration details:    Location:  Face   Face location:  R eyebrow   Length (cm):  8   Depth (mm):  3 Exploration:    Imaging outcome: foreign body not noted     Wound exploration: wound explored through full range of motion and entire depth of wound visualized   Treatment:    Area cleansed with:  Soap and water   Amount of cleaning:  Extensive    Irrigation solution:  Sterile saline   Irrigation volume:  1 L   Irrigation method:  Pressure wash Skin repair:    Repair method:  Sutures   Suture size:  6-0   Suture material:  Prolene   Suture technique:  Simple interrupted   Number of sutures:  17 Approximation:    Approximation:  Loose Repair type:    Repair type:  Intermediate Post-procedure details:    Dressing:  Antibiotic ointment   Procedure completion:  Tolerated well, no immediate complications    Medications Ordered in the ED  lidocaine -EPINEPHrine -tetracaine  (LET) topical gel (3 mLs Topical Given 03/06/24 1321)  lidocaine -EPINEPHrine  (XYLOCAINE  W/EPI) 2 %-1:200000 (PF) injection 10 mL (10 mLs Infiltration Given by Other 03/06/24 1325)  Tdap (ADACEL ) injection 0.5 mL (0.5 mLs Intramuscular Given 03/06/24 1322)  oxyCODONE -acetaminophen  (PERCOCET/ROXICET) 5-325 MG per tablet 1-2 tablet (1 tablet Oral Given 03/06/24 1501)  ondansetron  (ZOFRAN -ODT) disintegrating tablet 4 mg (4 mg Oral Given 03/06/24 1501)                                    Medical Decision Making Amount and/or Complexity of Data Reviewed Radiology: ordered.  Risk Prescription drug management.   This patient is a 54 y.o. female who presents to the ED for concern of mechanical fall, facial laceration  Past Medical History / Co-morbidities / Social History:  has a past medical history of Abscess (11/30/2013), Heavy periods (11/30/2013), Nicotine addiction (11/30/2013), and PMS (premenstrual syndrome) (11/30/2013).  Additional history: Chart reviewed.  Physical Exam: Physical exam performed. The pertinent findings include:  No racoon eyes No battle sign  8 cm stellate laceration noted to the right upper eyelid through the eyebrow. Wound does not traverse through the eyelid. No active bleeding, intact facial muscles. Kerney is not visualized. No signs of foreign body  No midline tenderness, no stepoffs or deformity noted on palpation of cervical, thoracic,  and lumbar spine  Dorsal aspect of the right thumb with some tenderness to palpation along thenar eminence.  ROM intact with some discomfort.  No pain with flexion extension of the wrist.  Compartments are soft, no snuffbox tenderness.  No pain to the wrist.  TTP noted to the lateral right  hip/femur area. No wound or sign of foreign body presence. Hematoma noted. No obvious deformity. DP and PT pulses intact and 2+.  Compartments soft.  Ambulatory with slow but steady gait.    Imaging Studies: I ordered imaging studies including ct head, DG right femur, right hand, pelvis. I independently visualized and interpreted imaging which showed   CT head: 1. No acute intracranial abnormality. No calvarial fracture. 2. Laceration of the right forehead with mild soft tissue swelling.   DG hand: Negative.   DG right femur: 1. No acute osseous abnormality. 2. Findings which may represent a small radiopaque foreign body within the soft tissues adjacent to the lateral aspect of the mid right femoral shaft. Correlation with physical examination is recommended.  DG pelvis: Negative  I agree with the radiologist interpretation.  Upon direct inspection, no foreign body present to the area overlying the right hip. No wounds   Medications: I ordered medication including zofran , percocet, tdap for pain, nausea. Reevaluation of the patient after these medicines showed that the patient improved. I have reviewed the patients home medicines and have made adjustments as needed.   Disposition: After consideration of the diagnostic results and the patients response to treatment, I feel that emergency department workup does not suggest an emergent condition requiring admission or immediate intervention beyond what has been performed at this time. The plan is: Discharge with close outpatient follow-up and return precautions.  Patient's workup is benign, Pressure irrigation performed of patients wound. Wound explored  and base of wound visualized in a bloodless field without evidence of foreign body.  Laceration occurred < 8 hours prior to repair which was well tolerated per above procedure.  Tdap updated.  Pt has  no comorbidities to effect normal wound healing. Pt discharged  without antibiotics.  Discussed suture home care with patient and answered questions. Pt to follow-up for wound check and suture removal in 7 days; they are to return to the ED sooner for signs of infection. Pt is hemodynamically stable with no complaints prior to dc.   Also given ace wrap and crutches per her request for management of hip pain.  She does have full passive ROM to the hip without deformity and good pulses, low suspicion for occult fracture, no indication for further evaluation with CT imaging at this time.  Provided orthopedic referral for follow-up.  Evaluation and diagnostic testing in the emergency department does not suggest an emergent condition requiring admission or immediate intervention beyond what has been performed at this time.  Plan for discharge with close PCP follow-up.  Patient is understanding and amenable with plan, educated on red flag symptoms that would prompt immediate return.  Patient discharged in stable condition.  Final diagnoses:  Fall, initial encounter  Complex laceration of face, initial encounter  Right hip pain    ED Discharge Orders          Ordered    HYDROcodone -acetaminophen  (NORCO/VICODIN) 5-325 MG tablet  Every 6 hours PRN        03/06/24 1730    ondansetron  (ZOFRAN -ODT) 4 MG disintegrating tablet  Every 8 hours PRN        03/06/24 1730          An After Visit Summary was printed and given to the patient.      Syreeta Figler A, PA-C 03/06/24 CELESTER Suzette Pac, MD 03/07/24 1711  "

## 2024-03-06 NOTE — Discharge Instructions (Addendum)
 You were seen in the emergency department for your fall and face laceration.   We have closed your laceration(s) with nonabsorbable sutures. These need to be removed in 7-10 days. This can be done at any doctor's office, urgent care, or emergency department.   If any of the sutures come out before it is time for removal, that is okay. Make sure to keep the area as clean and dry as possible. You can let warm soapy water run over the area, but do NOT scrub it.   Watch out for signs of infection, like we discussed, including: increased redness, tenderness, or drainage of pus from the area. If this happens and you have not been prescribed an antibiotic, please seek medical attention for possible infection.   Additionally, you were seen for the bruise/injury to your hip.  X-ray imaging of this was negative for abnormalities, however it is quite bruised so it will likely be sore for the next few days. I have given you ace wrap to wear to help with pain as well as crutches to use as needed.  Additionally, I have given you a prescription for Vicodin which is a narcotic pain medication you can take as prescribed as needed for severe pain only.  Do not drive or operate heavy machinery while taking this medication as it can make you drowsy.  You can also take Tylenol /ibuprofen as needed for pain.  I have given you a referral to orthopedics with number to call to schedule an appointment for follow-up.  Please call at your earliest convenience.  Return if development of any new or worsening symptoms.

## 2024-03-06 NOTE — ED Notes (Signed)
  Pt teaching provided on medications that may cause drowsiness. Pt instructed not to drive or operate heavy machinery while taking the prescribed medication. Pt verbalized understanding.   Pt provided discharge instructions and prescription information. Pt was given the opportunity to ask questions and questions were answered.

## 2024-03-06 NOTE — ED Notes (Signed)
 Laceration of the forehead irrigated with saline solution and then LET applied.
